# Patient Record
Sex: Female | Born: 1990 | Race: White | Hispanic: No | Marital: Single
Health system: Southern US, Community
[De-identification: ages and names within clinical notes are randomized; demographics above are authoritative.]

---
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
## 2011-02-16 LAB — BASIC METABOLIC PANEL
BUN: 11
CO2: 24
Calcium: 8.8
Chloride: 102
Creatinine, Ser: 0.68

## 2011-02-16 LAB — RAPID URINE DRUG SCREEN, HOSP PERFORMED
Amphetamines: NOT DETECTED
Barbiturates: NOT DETECTED
Benzodiazepines: NOT DETECTED
Opiates: NOT DETECTED
Tetrahydrocannabinol: NOT DETECTED

## 2011-02-16 LAB — I-STAT 8, (EC8 V) (CONVERTED LAB)
BUN: 13
Bicarbonate: 24.5 — ABNORMAL HIGH
Chloride: 105
Glucose, Bld: 98
pCO2, Ven: 41.4 — ABNORMAL LOW
pH, Ven: 7.381 — ABNORMAL HIGH

## 2013-10-30 ENCOUNTER — Other Ambulatory Visit: Payer: Self-pay | Admitting: Obstetrics and Gynecology

## 2013-10-30 DIAGNOSIS — N63 Unspecified lump in unspecified breast: Secondary | ICD-10-CM

## 2013-11-01 ENCOUNTER — Ambulatory Visit
Admission: RE | Admit: 2013-11-01 | Discharge: 2013-11-01 | Disposition: A | Discharge status: Home / Self Care | Payer: BC Managed Care – PPO | Source: Ambulatory Visit | Attending: Obstetrics and Gynecology | Admitting: Obstetrics and Gynecology

## 2013-11-01 DIAGNOSIS — N63 Unspecified lump in unspecified breast: Secondary | ICD-10-CM

## 2016-09-21 ENCOUNTER — Other Ambulatory Visit: Payer: Self-pay | Admitting: Family Medicine

## 2016-09-21 DIAGNOSIS — J3489 Other specified disorders of nose and nasal sinuses: Secondary | ICD-10-CM

## 2016-09-21 DIAGNOSIS — J351 Hypertrophy of tonsils: Secondary | ICD-10-CM

## 2016-09-22 ENCOUNTER — Ambulatory Visit
Admission: RE | Admit: 2016-09-22 | Discharge: 2016-09-22 | Disposition: A | Discharge status: Home / Self Care | Payer: BLUE CROSS/BLUE SHIELD | Source: Ambulatory Visit | Attending: Family Medicine | Admitting: Family Medicine

## 2016-09-22 DIAGNOSIS — J3489 Other specified disorders of nose and nasal sinuses: Secondary | ICD-10-CM

## 2016-09-22 DIAGNOSIS — J351 Hypertrophy of tonsils: Secondary | ICD-10-CM

## 2016-11-01 ENCOUNTER — Other Ambulatory Visit: Payer: Self-pay | Admitting: Family Medicine

## 2016-11-01 DIAGNOSIS — R1011 Right upper quadrant pain: Secondary | ICD-10-CM

## 2016-11-18 ENCOUNTER — Ambulatory Visit
Admission: RE | Admit: 2016-11-18 | Discharge: 2016-11-18 | Disposition: A | Discharge status: Home / Self Care | Payer: BLUE CROSS/BLUE SHIELD | Source: Ambulatory Visit | Attending: Family Medicine | Admitting: Family Medicine

## 2016-11-18 DIAGNOSIS — R1011 Right upper quadrant pain: Secondary | ICD-10-CM

## 2017-01-13 ENCOUNTER — Other Ambulatory Visit (HOSPITAL_COMMUNITY): Payer: Self-pay | Admitting: Gastroenterology

## 2017-01-13 DIAGNOSIS — R1011 Right upper quadrant pain: Secondary | ICD-10-CM

## 2017-01-26 ENCOUNTER — Ambulatory Visit (HOSPITAL_COMMUNITY): Payer: BLUE CROSS/BLUE SHIELD

## 2017-01-26 ENCOUNTER — Encounter (HOSPITAL_COMMUNITY): Payer: Self-pay

## 2017-02-10 ENCOUNTER — Ambulatory Visit (HOSPITAL_COMMUNITY)
Admission: RE | Admit: 2017-02-10 | Discharge: 2017-02-10 | Disposition: A | Discharge status: Home / Self Care | Payer: BLUE CROSS/BLUE SHIELD | Source: Ambulatory Visit | Attending: Gastroenterology | Admitting: Gastroenterology

## 2017-02-10 ENCOUNTER — Encounter (HOSPITAL_COMMUNITY): Payer: Self-pay | Admitting: Radiology

## 2017-02-10 DIAGNOSIS — R1011 Right upper quadrant pain: Secondary | ICD-10-CM

## 2017-02-10 MED ORDER — TECHNETIUM TC 99M MEBROFENIN IV KIT
5.0800 | PACK | Freq: Once | INTRAVENOUS | Status: AC | PRN
  Administered 2017-02-10: 5.08 via INTRAVENOUS

## 2018-05-05 IMAGING — NM NM HEPATO W/GB/PHARM/[PERSON_NAME]
2 series · 12 of 12 positions shown · non-contrast
Comparison: None.

CLINICAL DATA: Right upper quadrant pain

EXAM:
NUCLEAR MEDICINE HEPATOBILIARY IMAGING WITH GALLBLADDER EF
TECHNIQUE: Sequential images of the abdomen were obtained [DATE] minutes
following intravenous administration of radiopharmaceutical. After
oral ingestion of Ensure, gallbladder ejection fraction was
determined. At 60 min, normal ejection fraction is greater than 33%.
RADIOPHARMACEUTICALS:  5.08 mCi Cc-GGm  Choletec IV

[he hepatobiliary · 3.10mm/px · 6 of 43 frames shown (1 of 2)]
[frame 4/43]
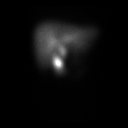
[frame 11/43]
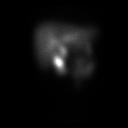
[frame 18/43]
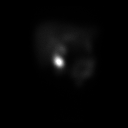
[frame 25/43]
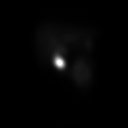
[frame 32/43]
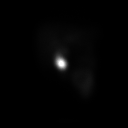
[frame 40/43]
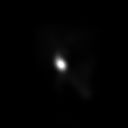

[he hepatobiliary · 3.10mm/px · 6 of 60 frames shown (2 of 2)]
[frame 6/60]
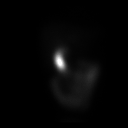
[frame 16/60]
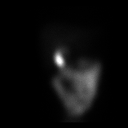
[frame 26/60]
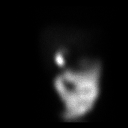
[frame 36/60]
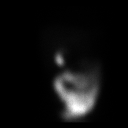
[frame 46/60]
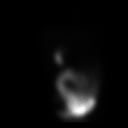
[frame 56/60]
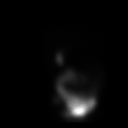

[12 of 12 positions shown; findings below may reference images not displayed]

FINDINGS: Prompt uptake and biliary excretion of activity by the liver is
seen. Gallbladder activity is visualized, consistent with patency of
cystic duct. Biliary activity passes into small bowel, consistent
with patent common bile duct.

Calculated gallbladder ejection fraction is 92%. (Normal gallbladder
ejection fraction with Ensure is greater than 33%.)
IMPRESSION: Normal uptake and excretion of biliary tracer.

Normal gallbladder ejection fraction.

## 2018-09-13 ENCOUNTER — Other Ambulatory Visit: Payer: Self-pay | Admitting: Orthopedic Surgery

## 2018-09-13 DIAGNOSIS — S62014K Nondisplaced fracture of distal pole of navicular [scaphoid] bone of right wrist, subsequent encounter for fracture with nonunion: Secondary | ICD-10-CM

## 2018-09-20 ENCOUNTER — Other Ambulatory Visit: Payer: BLUE CROSS/BLUE SHIELD

## 2022-09-10 ENCOUNTER — Other Ambulatory Visit: Payer: Self-pay | Admitting: Gastroenterology

## 2022-09-10 DIAGNOSIS — R109 Unspecified abdominal pain: Secondary | ICD-10-CM

## 2022-10-13 ENCOUNTER — Other Ambulatory Visit: Payer: BLUE CROSS/BLUE SHIELD

## 2023-02-21 ENCOUNTER — Other Ambulatory Visit (HOSPITAL_COMMUNITY): Payer: Self-pay | Admitting: Gastroenterology

## 2023-02-21 DIAGNOSIS — R1011 Right upper quadrant pain: Secondary | ICD-10-CM

## 2023-09-05 ENCOUNTER — Other Ambulatory Visit: Payer: Self-pay | Admitting: Gastroenterology

## 2023-09-05 DIAGNOSIS — R109 Unspecified abdominal pain: Secondary | ICD-10-CM
